# Patient Record
Sex: Female | Born: 1975 | Race: Black or African American | Hispanic: No | Marital: Married | Smoking: Never smoker
Health system: Southern US, Community
[De-identification: ages and names within clinical notes are randomized; demographics above are authoritative.]

## PROBLEM LIST (undated history)

## (undated) DIAGNOSIS — O26851 Spotting complicating pregnancy, first trimester: Secondary | ICD-10-CM

## (undated) DIAGNOSIS — Z87440 Personal history of urinary (tract) infections: Secondary | ICD-10-CM

## (undated) DIAGNOSIS — D649 Anemia, unspecified: Secondary | ICD-10-CM

## (undated) DIAGNOSIS — N83202 Unspecified ovarian cyst, left side: Secondary | ICD-10-CM

## (undated) DIAGNOSIS — R638 Other symptoms and signs concerning food and fluid intake: Secondary | ICD-10-CM

## (undated) DIAGNOSIS — Z87898 Personal history of other specified conditions: Secondary | ICD-10-CM

## (undated) DIAGNOSIS — Z8742 Personal history of other diseases of the female genital tract: Secondary | ICD-10-CM

## (undated) DIAGNOSIS — A630 Anogenital (venereal) warts: Secondary | ICD-10-CM

## (undated) DIAGNOSIS — IMO0002 Reserved for concepts with insufficient information to code with codable children: Secondary | ICD-10-CM

## (undated) HISTORY — DX: Anogenital (venereal) warts: A63.0

## (undated) HISTORY — DX: Personal history of other diseases of the female genital tract: Z87.42

## (undated) HISTORY — DX: Personal history of urinary (tract) infections: Z87.440

## (undated) HISTORY — DX: Reserved for concepts with insufficient information to code with codable children: IMO0002

## (undated) HISTORY — DX: Spotting complicating pregnancy, first trimester: O26.851

## (undated) HISTORY — DX: Unspecified ovarian cyst, left side: N83.202

## (undated) HISTORY — DX: Other symptoms and signs concerning food and fluid intake: R63.8

## (undated) HISTORY — DX: Anemia, unspecified: D64.9

## (undated) HISTORY — DX: Personal history of other specified conditions: Z87.898

---

## 2000-03-28 HISTORY — PX: WISDOM TOOTH EXTRACTION: SHX21

## 2003-03-29 DIAGNOSIS — R87619 Unspecified abnormal cytological findings in specimens from cervix uteri: Secondary | ICD-10-CM

## 2003-03-29 DIAGNOSIS — IMO0002 Reserved for concepts with insufficient information to code with codable children: Secondary | ICD-10-CM

## 2003-03-29 HISTORY — DX: Unspecified abnormal cytological findings in specimens from cervix uteri: R87.619

## 2003-03-29 HISTORY — PX: LEEP: SHX91

## 2003-03-29 HISTORY — DX: Reserved for concepts with insufficient information to code with codable children: IMO0002

## 2005-10-26 DIAGNOSIS — Z8742 Personal history of other diseases of the female genital tract: Secondary | ICD-10-CM

## 2005-10-26 HISTORY — DX: Personal history of other diseases of the female genital tract: Z87.42

## 2005-10-31 ENCOUNTER — Other Ambulatory Visit: Admission: RE | Admit: 2005-10-31 | Discharge: 2005-10-31 | Payer: Self-pay | Admitting: Obstetrics and Gynecology

## 2006-07-04 ENCOUNTER — Other Ambulatory Visit: Admission: RE | Admit: 2006-07-04 | Discharge: 2006-07-04 | Payer: Self-pay | Admitting: Family Medicine

## 2007-07-04 ENCOUNTER — Other Ambulatory Visit: Admission: RE | Admit: 2007-07-04 | Discharge: 2007-07-04 | Payer: Self-pay | Admitting: Family Medicine

## 2008-02-15 DIAGNOSIS — Z87898 Personal history of other specified conditions: Secondary | ICD-10-CM

## 2008-02-15 DIAGNOSIS — O26851 Spotting complicating pregnancy, first trimester: Secondary | ICD-10-CM

## 2008-02-15 HISTORY — DX: Spotting complicating pregnancy, first trimester: O26.851

## 2008-02-15 HISTORY — DX: Personal history of other specified conditions: Z87.898

## 2008-03-28 DIAGNOSIS — R638 Other symptoms and signs concerning food and fluid intake: Secondary | ICD-10-CM

## 2008-03-28 HISTORY — DX: Other symptoms and signs concerning food and fluid intake: R63.8

## 2008-10-20 ENCOUNTER — Inpatient Hospital Stay (HOSPITAL_COMMUNITY): Admission: RE | Admit: 2008-10-20 | Discharge: 2008-10-24 | Payer: Self-pay | Admitting: Obstetrics and Gynecology

## 2010-01-07 DIAGNOSIS — N83202 Unspecified ovarian cyst, left side: Secondary | ICD-10-CM

## 2010-01-07 HISTORY — DX: Unspecified ovarian cyst, left side: N83.202

## 2010-04-14 ENCOUNTER — Encounter (INDEPENDENT_AMBULATORY_CARE_PROVIDER_SITE_OTHER): Payer: Self-pay | Admitting: Surgery

## 2010-04-14 ENCOUNTER — Ambulatory Visit (HOSPITAL_COMMUNITY)
Admission: RE | Admit: 2010-04-14 | Discharge: 2010-04-16 | Payer: Self-pay | Source: Home / Self Care | Attending: Surgery | Admitting: Surgery

## 2010-04-19 LAB — CBC
HCT: 39 % (ref 36.0–46.0)
HCT: 37.4 % (ref 36.0–46.0)
HCT: 38.6 % (ref 36.0–46.0)
HCT: 36.2 % (ref 36.0–46.0)
Hemoglobin: 12.9 g/dL (ref 12.0–15.0)
Hemoglobin: 12.3 g/dL (ref 12.0–15.0)
Hemoglobin: 12.4 g/dL (ref 12.0–15.0)
Hemoglobin: 12 g/dL (ref 12.0–15.0)
MCH: 28.4 pg (ref 26.0–34.0)
MCH: 28.3 pg (ref 26.0–34.0)
MCH: 27.6 pg (ref 26.0–34.0)
MCH: 27.8 pg (ref 26.0–34.0)
MCHC: 33.1 g/dL (ref 30.0–36.0)
MCHC: 32.9 g/dL (ref 30.0–36.0)
MCHC: 32.1 g/dL (ref 30.0–36.0)
MCHC: 33.1 g/dL (ref 30.0–36.0)
MCV: 85.7 fL (ref 78.0–100.0)
MCV: 86.2 fL (ref 78.0–100.0)
MCV: 86 fL (ref 78.0–100.0)
MCV: 84 fL (ref 78.0–100.0)
Platelets: 221 10*3/uL (ref 150–400)
Platelets: 231 10*3/uL (ref 150–400)
Platelets: 240 10*3/uL (ref 150–400)
Platelets: 198 10*3/uL (ref 150–400)
RBC: 4.55 MIL/uL (ref 3.87–5.11)
RBC: 4.34 MIL/uL (ref 3.87–5.11)
RBC: 4.49 MIL/uL (ref 3.87–5.11)
RBC: 4.31 MIL/uL (ref 3.87–5.11)
RDW: 15.1 % (ref 11.5–15.5)
RDW: 15.1 % (ref 11.5–15.5)
RDW: 15 % (ref 11.5–15.5)
RDW: 14.8 % (ref 11.5–15.5)
WBC: 4 10*3/uL (ref 4.0–10.5)
WBC: 8.8 10*3/uL (ref 4.0–10.5)
WBC: 6.4 10*3/uL (ref 4.0–10.5)
WBC: 4.9 10*3/uL (ref 4.0–10.5)

## 2010-04-19 LAB — COMPREHENSIVE METABOLIC PANEL
ALT: 568 U/L — ABNORMAL HIGH (ref 0–35)
ALT: 599 U/L — ABNORMAL HIGH (ref 0–35)
ALT: 672 U/L — ABNORMAL HIGH (ref 0–35)
AST: 211 U/L — ABNORMAL HIGH (ref 0–37)
AST: 269 U/L — ABNORMAL HIGH (ref 0–37)
AST: 294 U/L — ABNORMAL HIGH (ref 0–37)
Albumin: 3.9 g/dL (ref 3.5–5.2)
Albumin: 3.3 g/dL — ABNORMAL LOW (ref 3.5–5.2)
Albumin: 3.2 g/dL — ABNORMAL LOW (ref 3.5–5.2)
Alkaline Phosphatase: 130 U/L — ABNORMAL HIGH (ref 39–117)
Alkaline Phosphatase: 116 U/L (ref 39–117)
Alkaline Phosphatase: 109 U/L (ref 39–117)
BUN: 7 mg/dL (ref 6–23)
BUN: 5 mg/dL — ABNORMAL LOW (ref 6–23)
BUN: 6 mg/dL (ref 6–23)
CO2: 24 mEq/L (ref 19–32)
CO2: 24 mEq/L (ref 19–32)
CO2: 25 mEq/L (ref 19–32)
Calcium: 9.7 mg/dL (ref 8.4–10.5)
Calcium: 9.3 mg/dL (ref 8.4–10.5)
Calcium: 9 mg/dL (ref 8.4–10.5)
Chloride: 106 mEq/L (ref 96–112)
Chloride: 105 mEq/L (ref 96–112)
Chloride: 104 mEq/L (ref 96–112)
Creatinine, Ser: 0.8 mg/dL (ref 0.4–1.2)
Creatinine, Ser: 0.79 mg/dL (ref 0.4–1.2)
Creatinine, Ser: 0.75 mg/dL (ref 0.4–1.2)
GFR calc Af Amer: >60 mL/min (ref >60)
GFR calc Af Amer: >60 mL/min (ref >60)
GFR calc Af Amer: >60 mL/min (ref >60)
GFR calc non Af Amer: >60 mL/min (ref >60)
GFR calc non Af Amer: >60 mL/min (ref >60)
GFR calc non Af Amer: >60 mL/min (ref >60)
Glucose, Bld: 84 mg/dL (ref 70–99)
Glucose, Bld: 91 mg/dL (ref 70–99)
Glucose, Bld: 83 mg/dL (ref 70–99)
Potassium: 4.1 mEq/L (ref 3.5–5.1)
Potassium: 4.4 mEq/L (ref 3.5–5.1)
Potassium: 3.9 mEq/L (ref 3.5–5.1)
Sodium: 139 mEq/L (ref 135–145)
Sodium: 137 mEq/L (ref 135–145)
Sodium: 138 mEq/L (ref 135–145)
Total Bilirubin: 7.3 mg/dL — ABNORMAL HIGH (ref 0.3–1.2)
Total Bilirubin: 6 mg/dL — ABNORMAL HIGH (ref 0.3–1.2)
Total Bilirubin: 5 mg/dL — ABNORMAL HIGH (ref 0.3–1.2)
Total Protein: 8.2 g/dL (ref 6.0–8.3)
Total Protein: 7.3 g/dL (ref 6.0–8.3)
Total Protein: 6.5 g/dL (ref 6.0–8.3)

## 2010-04-19 LAB — DIFFERENTIAL
Basophils Absolute: 0 10*3/uL (ref 0.0–0.1)
Basophils Absolute: 0 10*3/uL (ref 0.0–0.1)
Basophils Absolute: 0 10*3/uL (ref 0.0–0.1)
Basophils Relative: 1 % (ref 0–1)
Basophils Relative: 1 % (ref 0–1)
Basophils Relative: 0 % (ref 0–1)
Eosinophils Absolute: 0.1 10*3/uL (ref 0.0–0.7)
Eosinophils Absolute: 0.1 10*3/uL (ref 0.0–0.7)
Eosinophils Absolute: 0 10*3/uL (ref 0.0–0.7)
Eosinophils Relative: 2 % (ref 0–5)
Eosinophils Relative: 1 % (ref 0–5)
Eosinophils Relative: 0 % (ref 0–5)
Lymphocytes Relative: 47 % — ABNORMAL HIGH (ref 12–46)
Lymphocytes Relative: 36 % (ref 12–46)
Lymphocytes Relative: 22 % (ref 12–46)
Lymphs Abs: 1.9 10*3/uL (ref 0.7–4.0)
Lymphs Abs: 3.1 10*3/uL (ref 0.7–4.0)
Lymphs Abs: 1.4 10*3/uL (ref 0.7–4.0)
Monocytes Absolute: 0.3 10*3/uL (ref 0.1–1.0)
Monocytes Absolute: 0.3 10*3/uL (ref 0.1–1.0)
Monocytes Absolute: 0.3 10*3/uL (ref 0.1–1.0)
Monocytes Relative: 7 % (ref 3–12)
Monocytes Relative: 3 % (ref 3–12)
Monocytes Relative: 5 % (ref 3–12)
Neutro Abs: 1.8 10*3/uL (ref 1.7–7.7)
Neutro Abs: 5.3 10*3/uL (ref 1.7–7.7)
Neutro Abs: 4.6 10*3/uL (ref 1.7–7.7)
Neutrophils Relative %: 44 % (ref 43–77)
Neutrophils Relative %: 60 % (ref 43–77)
Neutrophils Relative %: 73 % (ref 43–77)

## 2010-04-19 LAB — BASIC METABOLIC PANEL
BUN: 6 mg/dL (ref 6–23)
CO2: 23 mEq/L (ref 19–32)
Calcium: 9.2 mg/dL (ref 8.4–10.5)
Chloride: 107 mEq/L (ref 96–112)
Creatinine, Ser: 0.82 mg/dL (ref 0.4–1.2)
GFR calc Af Amer: >60 mL/min (ref >60)
GFR calc non Af Amer: >60 mL/min (ref >60)
Glucose, Bld: 122 mg/dL — ABNORMAL HIGH (ref 70–99)
Potassium: 3.7 mEq/L (ref 3.5–5.1)
Sodium: 138 mEq/L (ref 135–145)

## 2010-04-19 LAB — PROTIME-INR
INR: 0.94 (ref 0.00–1.49)
INR: 0.97 (ref 0.00–1.49)
Prothrombin Time: 12.8 seconds (ref 11.6–15.2)
Prothrombin Time: 13.1 seconds (ref 11.6–15.2)

## 2010-04-19 LAB — SURGICAL PCR SCREEN
MRSA, PCR: NEGATIVE
Staphylococcus aureus: NEGATIVE

## 2010-04-19 LAB — APTT: aPTT: 37 seconds (ref 24–37)

## 2010-04-19 LAB — PREGNANCY, URINE: Preg Test, Ur: NEGATIVE

## 2010-04-19 LAB — LIPASE, BLOOD: Lipase: 39 U/L (ref 11–59)

## 2010-04-23 NOTE — Op Note (Signed)
Tara [name]               ACCOUNT NO.:  [id number]  MEDICAL RECORD NO.:  [id number]          PATIENT TYPE:  AMB  LOCATION:  DAY                          FACILITY:  [facility]  PHYSICIAN:  Thornton Park. Daphine Deutscher, MD  DATE OF BIRTH:  1975-05-22  DATE OF PROCEDURE:  04/14/2010 DATE OF DISCHARGE:                              OPERATIVE REPORT   PREOPERATIVE DIAGNOSES:  Gallstones and common bile duct stones with elevated liver function tests.  POSTOPERATIVE DIAGNOSES: 1. Gallstones and common bile duct stones with elevated liver function     tests. 2. Chronic cholecystitis.  PROCEDURE:  Laparoscopic cholecystectomy with intraoperative cholangiogram and placement of a 19 Blake drain.  SURGEON:  Luretha Murphy, MD  ASSISTANT:  Consuello Bossier, MD  ANESTHESIA:  General endotracheal.  DESCRIPTION OF PROCEDURE:  Tara [name] is a 35 year old lady who has had bouts of abdominal pain and recently has had some elevation of her liver function studies and bilirubin.  I saw her in the office last Friday, and arrangements were made for cholecystectomy today.  Informed consent was obtained.  The patient was taken to room one on the afternoon of April 14, 2010 and given general anesthesia.  The abdomen was prepped with Techni-Care equivalent and draped sterilely. I attempted to go in with a 5 mm through the umbilicus, elevating the umbilicus.  I got into the preperitoneal space.  I went ahead and cut down longitudinally and made a big enough incision for a standard Hassan approach and entered the abdomen without difficulty.  The abdomen was insufflated.  A 10 was placed the upper abdomen and two 5 laterally.  She had numerous adhesions to the gallbladder fundus and neck.  These were taken down with sharp dissection.  The fundus was then elevated.  She had a lot of inflammatory changes here in the cystic duct.  The cystic duct was quite dilated which I could see.  I ended up putting a  clip along the gallbladder side incising the cystic duct and tried to pass a catheter. I actually kind of got a back wall of part of the cystic duct.  It was dilated and took a cholangiogram which showed actually distally to that a much smaller little cystic duct and then a markedly dilated common bile duct with multiple stones and filling defects.  We then ligated the cystic duct with a 2-0 silk passed around it and then tied down with an Ethicon tying [place].  An Endo loop using PDS was then passed around it after I had cut it, and it was ligated.  The gallbladder was then removed with a hook cautery again without difficulty, and once it was detached, it was placed in a bag and brought out through the umbilicus.  We went back and looked at the gallbladder bed.  No bleeding or bile leaks were noted.  Because of the size the cystic duct, I went ahead and put a drain in in case there might be a leak.  The abdomen was deflated, and the port sites were all injected with Marcaine and closed.  The umbilical defect was repaired with three sutures  of 0-Vicryl under laparoscopic vision.  4-0 Vicryl was used in the skin, Benzoin and Steri-Strips.  The patient tolerated the procedure well and was taken to the recovery room in satisfactory condition.     Thornton Park Daphine Deutscher, MD     MBM/MEDQ  D:  04/14/2010  T:  04/14/2010  Job:  [id number]  cc:   Shirley Friar, MD Fax: [phone]  Electronically Signed by Luretha Murphy MD on 04/23/2010 08:30:45 PM

## 2010-06-03 NOTE — Op Note (Signed)
  Tara [name], Tara [name]               ACCOUNT NO.:  [id number]  MEDICAL RECORD NO.:  [id number]          PATIENT TYPE:  OIB  LOCATION:  1531                         FACILITY:  [facility]  PHYSICIAN:  Illiana Losurdo C. Madilyn Fireman, M.D.    DATE OF BIRTH:  Aug 20, 1975  DATE OF PROCEDURE: DATE OF DISCHARGE:                              OPERATIVE REPORT   PROCEDURE:  Endoscopic retrograde cholangiopancreatography with sphincterotomy and stone extraction.  INDICATIONS FOR PROCEDURE:  Common bile duct stones seen on intraoperative cholangiogram at time of laparoscopic cholecystectomy.  PROCEDURE:  The patient was placed in the prone position and placed on the pulse monitor with continuous low-flow oxygen delivered by nasal cannula.  He was sedated with 100 mcg of IV fentanyl and 7.5 mg IV Versed.  Olympus side-viewing endoscope was advanced blindly into the oropharynx, esophagus, stomach.  The pylorus was traversed in the papilla of Vater located on the medial duodenal wall.  It had an enlarged bulging appearance and was draining clear amber bile.  The Wilson-Cook sphincterotome was used to cannulate common bile duct which appeared dilated and had at least one mobile filling defect.  A large sphincterotomy was performed and then the adjustable 12-18 mm balloon was inflated in the common hepatic duct and dragged down through the papilla multiple times with one multifaceted stone delivered approximately 4 to 5 mm in diameter.  No other stones or stone fragments were seen to be delivered.  After multiple sweeps, the final cholangiogram revealed no obvious filling defect and there was good drainage.  The pancreatic duct was not injected.  The scope was then withdrawn and the patient returned to the recovery room in stable condition.  She tolerated the procedure well and there were no immediate complications.  IMPRESSION:  Common bile duct stone removed after sphincterotomy.  PLAN:  Advance diet gradually and  recheck liver function tests tomorrow.          ______________________________ Everardo All Madilyn Fireman, M.D.     JCH/MEDQ  D:  04/15/2010  T:  04/15/2010  Job:  [id number]  Electronically Signed by Dorena Cookey M.D. on 06/01/2010 07:32:29 PM

## 2010-06-03 NOTE — Consult Note (Signed)
  NAMEREBECKA, OELKERS               ACCOUNT NO.:  [id number]  MEDICAL RECORD NO.:  [id number]          PATIENT TYPE:  OIB  LOCATION:  1531                         FACILITY:  [facility]  PHYSICIAN:  Jahniya Duzan C. Madilyn Fireman, M.D.    DATE OF BIRTH:  [birth year]  DATE OF CONSULTATION:  04/15/2010 DATE OF DISCHARGE:                                CONSULTATION   REASON FOR CONSULTATION:  Common bile duct stone on intraoperative cholangiogram.  HISTORY OF PRESENT ILLNESS:  The patient is a 35 year old black female who presents with abdominal pain and jaundice with elevated liver function test.  Laparoscopic cholecystectomy was performed yesterday and showed multiple gallstones with dilated common bile duct and several common bile duct stones.  We are consulted for ERCP with removal of stones.  Her bilirubin today was 6.0, AST 1, AST 269, alkaline phosphatase 116, ALT 599.  She is on antibiotics Invanz.  She is feeling well.  PAST MEDICAL HISTORY:  Essentially noncontributory.  SURGERIES:  None.  ALLERGIES:  None.  MEDICATIONS:  None.  FAMILY HISTORY:  Noncontributory.  PHYSICAL EXAMINATION:  GENERAL:  Moderately obese black female, in no acute distress, moderately jaundiced. ABDOMEN:  Soft with fresh laparoscopy wounds.  Mild diffuse tenderness to light palpation around the wound site.  IMPRESSION:  Retained common bile duct stones after cholecystectomy.  PLAN:  We will proceed with ERCP today.  Risks, rationale, alternatives were explained to the patient.  She wished to proceed.  This will be done in near future.          ______________________________ Everardo All Madilyn Fireman, M.D.     JCH/MEDQ  D:  04/15/2010  T:  04/15/2010  Job:  [id number]  cc:   Thornton Park Daphine Deutscher, MD [address] [place] [address]  Electronically Signed by Dorena Cookey M.D. on 06/01/2010 07:32:29 PM

## 2010-07-04 LAB — COMPREHENSIVE METABOLIC PANEL
ALT: 13 U/L (ref 0–35)
AST: 21 U/L (ref 0–37)
Albumin: 2.8 g/dL — ABNORMAL LOW (ref 3.5–5.2)
Alkaline Phosphatase: 150 U/L — ABNORMAL HIGH (ref 39–117)
BUN: 5 mg/dL — ABNORMAL LOW (ref 6–23)
CO2: 21 mEq/L (ref 19–32)
Calcium: 9.2 mg/dL (ref 8.4–10.5)
Chloride: 103 mEq/L (ref 96–112)
Creatinine, Ser: 0.48 mg/dL (ref 0.4–1.2)
GFR calc Af Amer: >60 mL/min (ref >60)
GFR calc non Af Amer: >60 mL/min (ref >60)
Glucose, Bld: 89 mg/dL (ref 70–99)
Potassium: 4.1 mEq/L (ref 3.5–5.1)
Sodium: 135 mEq/L (ref 135–145)
Total Bilirubin: 0.6 mg/dL (ref 0.3–1.2)
Total Protein: 6 g/dL (ref 6.0–8.3)

## 2010-07-04 LAB — CBC
HCT: 24.9 % — ABNORMAL LOW (ref 36.0–46.0)
HCT: 32.7 % — ABNORMAL LOW (ref 36.0–46.0)
Hemoglobin: 11.1 g/dL — ABNORMAL LOW (ref 12.0–15.0)
Hemoglobin: 8.6 g/dL — ABNORMAL LOW (ref 12.0–15.0)
MCHC: 33.8 g/dL (ref 30.0–36.0)
MCHC: 34.6 g/dL (ref 30.0–36.0)
MCV: 87.1 fL (ref 78.0–100.0)
MCV: 87.4 fL (ref 78.0–100.0)
Platelets: 163 10*3/uL (ref 150–400)
Platelets: 182 10*3/uL (ref 150–400)
RBC: 2.85 MIL/uL — ABNORMAL LOW (ref 3.87–5.11)
RBC: 3.76 MIL/uL — ABNORMAL LOW (ref 3.87–5.11)
RDW: 15.2 % (ref 11.5–15.5)
RDW: 15.4 % (ref 11.5–15.5)
WBC: 15.1 10*3/uL — ABNORMAL HIGH (ref 4.0–10.5)
WBC: 7.2 10*3/uL (ref 4.0–10.5)

## 2010-07-04 LAB — RPR: RPR Ser Ql: NONREACTIVE

## 2010-07-04 LAB — URIC ACID: Uric Acid, Serum: 4.3 mg/dL (ref 2.4–7.0)

## 2010-07-04 LAB — LACTATE DEHYDROGENASE: LDH: 175 U/L (ref 94–250)

## 2010-08-10 NOTE — Discharge Summary (Signed)
Tara [name], [name]               ACCOUNT NO.:  [id number]   MEDICAL RECORD NO.:  [id number]          PATIENT TYPE:  INP   LOCATION:  9146                          [facility]AC[facility]:  [facility]   PHYSICIAN:  Naima A. Dillard, M.D. DATE OF BIRTH:  05-14-1975   DATE OF ADMISSION:  10/20/2008  DATE OF DISCHARGE:  10/24/2008                               DISCHARGE SUMMARY   ADMITTING DIAGNOSIS:  Intrauterine pregnancy at 76 and 6/7, admission  for term induction of labor.  Denies leakage of fluid followed by the  CNM Service at [facility].   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  She is obese.  History of  anemia during this pregnancy.  She denies medication allergy, latex,  drugs.  Her blood type is O positive.  Antibody negative.  Sickle cell  screen negative.  RPR negative.  Rubella immune.  Hepatitis antigen  negative.  Gonorrhea and Chlamydia cultures were done on November 20,  were negative.  Hemoglobin was 12 and her platelets were 275.  She was  admitted to the Labor and Delivery area where Cytotec was inserted to  facilitate delivery after 12 hours of medication.  Her cervix did not  change and Pitocin was started, low-dose.  Her hemoglobin at that point  was 11.1.  On October 20, 2008, at 9:45, no cervical change, discussed with  Dr. Normand Sloop and will discontinue the Pitocin and reinsert Cervidil and  she had an epidural administered on October 21, 2008, and after an hour or  2 of the epidural, she was rechecked and she was 8 cm, contractions were  every 2 minutes lasting 40-60 seconds.  Fetal heart rate was 140-150.  Head compression pattern was noted and she was turned from side to side  to facilitate recovery.  Labor progressed and her Pitocin was increased  due to no cervical change after 2 hours and her vaginal exam was 9+, 0  station.  The baby was okay.  At this point, the Pitocin was 4  milliunits.   We did increase the Pitocin and Dr. Stefano Gaul was given a  report.  She became complete on October 22, 2008, at 3:41 and on October 22, 2008, at 5:32, she had spontaneous vaginal delivery of a viable female  infant weighing 7 pounds 4 ounces of her second-degree laceration that  was repaired.  Baby rotated from OA to ROA and anterior and posterior  shoulders were delivered without incident.  The baby's Apgar was 8 and  9.  The baby's name is Tara [name].  The placenta was complete and intact with  3 cord vessels, stroke mechanism, 20 units of Pitocin IM with trailing  membranes noted that were removed.  Laceration was repaired with 3-0  Vicryl.  Hemostasis was obtained and fundus remained firm.  EBL 400 mL.  Dr. Stefano Gaul present at the birth and NICU was present due to thin  meconium with rupture of membranes.  On day of discharge, she was  feeling fine and she wanted to go home.  She is  breastfeeding.  Hemoglobin was 8.6, down from 11.1 on admission.   PHYSICAL EXAMINATION:  VITAL SIGNS:  Temperature 98.4, 93 pulse,  respirations of 20, and blood pressure 125/61 to 113/73.  LUNGS:  Clear bilaterally.  HEART:  Regular rate without murmur.  ABDOMEN:  Fundus is firm below umbilicus and lochia was rubra, healing  perineum and negative Homans.   ASSESSMENT:  Postpartum day #2 stable.  Anemia, nonsymptomatic and she  is breastfeeding.  Discharge home with [facility] pamphlet.  Medications were ferrous sulfate b.i.d. and prescription was given and  ibuprofen 800 mg was given also with instructions.  Questions were  answered concerning care of herself and baby after discharge home.  She  is to return to [facility] at 6 weeks for disposition of  birth control method.  At this point, she is not sure what she wants.  She is considering the Mirena.      Jasmine Awe, CNM      Naima A. Normand Sloop, M.D.  Electronically Signed    JM/MEDQ  D:  10/24/2008  T:  10/25/2008   Job:  [id number]

## 2010-08-10 NOTE — H&P (Signed)
Tara [name], Tara [name]               ACCOUNT NO.:  [id number]   MEDICAL RECORD NO.:  [id number]          PATIENT TYPE:  INP   LOCATION:  9174                          FACILITY:  [facility]   PHYSICIAN:  Naima A. Dillard, M.D. DATE OF BIRTH:  04-Feb-1976   DATE OF ADMISSION:  10/20/2008  DATE OF DISCHARGE:                              HISTORY & PHYSICAL   HISTORY OF PRESENT ILLNESS:  Ms. Tara [name] is a 35 year old married black  female, primigravida, at 41-3/7 weeks per an [facility] of October 11, 2008 who  presents for admission for term induction of labor.  She is without  complaints this morning.  She reported good fetal movement.  No leakage  of fluid or vaginal bleeding.  Followed by the CNM service at [facility].  History remarkable for:  1. 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  2. First trimester spotting, subchorionic hemorrhage.  3. Obese.  4. History of anemia this pregnancy.   ALLERGIES:  She denies medication or latex allergy.   MENSTRUAL HISTORY:  Menarche age 36, monthly cycles.  No abnormalities.  LMP of January 04, 2008, giving her best [facility] of October 11, 2008.   OBSTETRICAL HISTORY:  She is a primigravida.   PAST MEDICAL HISTORY:  1. History of a LEEP, 2005 with a positive HPV.  2. Reports varicella as a child.  3. Normal childhood illnesses.   SURGICAL HISTORY:  Otherwise remarkable for wisdom teeth, 2002.   FAMILY HISTORY:  Paternal uncle heart attack.  Mother hypertension.  Sister von Willebrand syndrome.  Mother and father non-insulin dependent  diabetics.  Paternal grandmother breast cancer.  She has some paternal  aunts with lung cancer.  Also paternal aunts with some unknown type of  cancer.   GENETIC HISTORY:  Father of baby's nephew, sickle cell disease or trait.  Father of baby's brother, MS.  Father of baby has twin sisters.   SOCIAL HISTORY:  She is now a married African American female.  She is  of [place] faith.  Her husband's name is Gaffer.  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  Father of baby works full-time also in Presenter, broadcasting, has had 16 years  of education.  He is involved and supportive.  She denied alcohol,  tobacco, or illicit drug use.   LABORATORY DATA:  Prenatal labs:  Her blood type is O+, Rh antibody  screen negative.  Sickle cell screen negative.  RPR nonreactive, rubella  titer immune.  Hepatitis surface antigen negative, HIV nonreactive.  Gonorrhea and chlamydia cultures November 20 were negative.  On November  20 her hemoglobin was 12.3.  Her platelets were 271.   She reported a pregravid weight of 215.  At her initial visit at [facility]  she was 225.  She is 5 feet 6 inches.  She complained of some pink  spotting February 14, 2008.  She entered care at [facility] on  November 20 and was around 6 weeks for her new OB interview.  She was  evaluated by our nurse practitioner in the office.  On November 24 she  was prescribed Macrobid for a UTI first trimester.  At 7 weeks and 4  days she had a viability ultrasound and EDC only varied by one date.  New OB exam was done at 10 weeks and 3 days.  She did have fetal heart  tones per Doppler, 170s.  Spotting had resolved.  She planned CNM care  and culture was negative at end of December.  Pap was within normal  limits.  Had a first trimester screen January 11, was within normal  limits.  Anatomy ultrasound was done at 19 weeks and 4 days.  They are  expecting a girl.  Size was equal to dates.  Fundal placenta, three-  vessel cord, cervix 3.78 cm, normal fluid.  All the cardiac anatomy was  not seen and did have a repeat ultrasound to visualize remaining anatomy  and was within normal limits and that one was done at 23 weeks.  She had  some upper respiratory complaints in second trimester.  Had a pipe burst  in her house and felt like it was related to the chemicals they used to  repair that.  Had early Glucola at 23 weeks  that was  within normal  limits equal to 105.  Did have her TSH checked.  It was 3.197,  hemoglobin was 11.1.  She had a repeat Glucola at 30 weeks and 3 days  and was within normal limits.  RPR was nonreactive.  She was started on  iron supplements twice a day.  GBS was negative.  In her third trimester  planning [facility] in [place] for pediatrician.  Weight was 240 at 37 weeks and 2 days and as I mentioned her initial  weight was 220-225.  Her pregnancy continued to progress without any  other complications until her presentation today for induction of labor.   OBJECTIVE:  VITAL SIGNS:  Her initial blood pressure on admission was  142/110 and heart rate was 112.  The repeat was 130/84.  She was  afebrile.  Other vital signs stable.  Fetal heart rate 140, reactive, no  decels, moderate variability.  Toco showed occasional ripples on the  monitor which are very mild to palpation.  She did have some PIH labs  drawn.  Her white blood cell count was 7.2, hemoglobin 11.1, hematocrit  32.7, and platelets were 182.  Her CMET was within normal limits.  Just  to note AST was 21, ALT 13, LDH 175.  Uric acid 4.3.   PHYSICAL EXAMINATION:  GENERAL:  No acute distress, alert and oriented  x3 and pleasant.  HEENT:  Within normal limits.  CARDIOVASCULAR:  Regular rate and rhythm.  LUNGS:  Clear to auscultation bilaterally.  ABDOMEN:  Soft, nontender, gravid.  CERVIX:  1-1/2, 80, -2, vertex, mid position.  EXTREMITIES:  1+ kind of generalized edema, no clonus.  DTRs were 1+.   IMPRESSION:  1. Intrauterine pregnancy at 41weeks and 3 days.  2. Term.  3. Reactive fetal heart tracing.  4. Borderline blood pressures with normal PIH labs.   PLAN:  1. Admit to birthing suites with Dr. Normand Sloop as attending physician.  2. Routine L and D orders.  3. Pitocin IV per low-dose protocol.  4. Consult with M.D. p.r.n.      Candice [place], CNM      Naima A. Normand Sloop, M.D.   Electronically Signed    CHS/MEDQ  D:  10/20/2008  T:  10/20/2008  Job:  [id number]

## 2011-07-26 ENCOUNTER — Ambulatory Visit: Payer: Self-pay | Admitting: Obstetrics and Gynecology

## 2011-08-11 ENCOUNTER — Ambulatory Visit (INDEPENDENT_AMBULATORY_CARE_PROVIDER_SITE_OTHER): Payer: BC Managed Care – HMO | Admitting: Obstetrics and Gynecology

## 2011-08-11 ENCOUNTER — Encounter: Payer: Self-pay | Admitting: Obstetrics and Gynecology

## 2011-08-11 VITALS — BP 120/76 | Resp 18 | Ht 66.5 in | Wt 228.0 lb

## 2011-08-11 DIAGNOSIS — Z124 Encounter for screening for malignant neoplasm of cervix: Secondary | ICD-10-CM

## 2011-08-11 NOTE — Progress Notes (Signed)
Contraception None/ tyring to conceive Last pap 11/2009 Last Mammo none Last Colonoscopy none Last Dexa Scan none Primary MD Dr. Lupe Carney Abuse at Home None  No complaints.  Trying to conceive.  Filed Vitals:   08/11/11 1603  BP: 120/76  Resp: 18   ROS: noncontributory  Physical Examination: General appearance - alert, well appearing, and in no distress Neck - supple, no significant adenopathy Chest - clear to auscultation, no wheezes, rales or rhonchi, symmetric air entry Heart - normal rate and regular rhythm Abdomen - soft, nontender, nondistended, no masses or organomegaly Breasts - breasts appear normal, no suspicious masses, no skin or nipple changes or axillary nodes Pelvic - normal external genitalia, vulva, vagina, cervix, uterus and adnexa Back exam - no CVAT Extremities - no edema, redness or tenderness in the calves or thighs  A/P Pap RTO for AEX or prn

## 2011-08-16 LAB — PAP IG W/ RFLX HPV ASCU

## 2011-08-18 ENCOUNTER — Telehealth: Payer: Self-pay

## 2011-08-18 NOTE — Telephone Encounter (Signed)
Spoke to pt re pap results and rec for colpo. Colpo scheduled for 09/05/2011. Tara [name] A

## 2011-08-18 NOTE — Telephone Encounter (Signed)
LM for pt to cb re: test results and recommendations. She needs colpo. 09/05/2011 appt available @ 4pm if that works for pt. Melody Comas A

## 2011-08-23 ENCOUNTER — Telehealth: Payer: Self-pay

## 2011-08-23 NOTE — Telephone Encounter (Signed)
I spoke to pt to answer ?'s re: pap results, HPV and colposcopy. I sent her info on abnl paps, HPV and colposcopy in the mail.  Pt is scheduled for colpo on 09/05/2011. Melody Comas A

## 2011-09-05 ENCOUNTER — Ambulatory Visit (INDEPENDENT_AMBULATORY_CARE_PROVIDER_SITE_OTHER): Payer: BC Managed Care – HMO | Admitting: Obstetrics and Gynecology

## 2011-09-05 ENCOUNTER — Encounter: Payer: Self-pay | Admitting: Obstetrics and Gynecology

## 2011-09-05 VITALS — BP 110/62 | Ht 67.2 in | Wt 234.0 lb

## 2011-09-05 DIAGNOSIS — IMO0002 Reserved for concepts with insufficient information to code with codable children: Secondary | ICD-10-CM

## 2011-09-05 DIAGNOSIS — R87612 Low grade squamous intraepithelial lesion on cytologic smear of cervix (LGSIL): Secondary | ICD-10-CM

## 2011-09-05 LAB — POCT URINE PREGNANCY: Preg Test, Ur: NEGATIVE

## 2011-09-05 NOTE — Progress Notes (Signed)
S:No complaints O: Filed Vitals:   09/05/11 1615  BP: 110/62   Colpo performed per protocol TZ seen AW lesions at 2, 4 and 9 O'Clock Bxs done  A/P RTO 2 wks for f/u Many Questions answered

## 2011-09-07 LAB — PATHOLOGY

## 2011-09-26 ENCOUNTER — Encounter: Payer: BC Managed Care – HMO | Admitting: Obstetrics and Gynecology

## 2011-10-10 ENCOUNTER — Encounter: Payer: Self-pay | Admitting: Obstetrics and Gynecology

## 2011-10-10 ENCOUNTER — Ambulatory Visit (INDEPENDENT_AMBULATORY_CARE_PROVIDER_SITE_OTHER): Payer: BC Managed Care – HMO | Admitting: Obstetrics and Gynecology

## 2011-10-10 VITALS — BP 100/64 | Ht 66.0 in | Wt 236.0 lb

## 2011-10-10 DIAGNOSIS — IMO0002 Reserved for concepts with insufficient information to code with codable children: Secondary | ICD-10-CM

## 2011-10-10 DIAGNOSIS — R87612 Low grade squamous intraepithelial lesion on cytologic smear of cervix (LGSIL): Secondary | ICD-10-CM

## 2011-10-10 DIAGNOSIS — N870 Mild cervical dysplasia: Secondary | ICD-10-CM

## 2011-10-10 NOTE — Progress Notes (Signed)
Report Comments: FINAL DIAGNOSIS: A. 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 See comment.   B. 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  See comment.  C. Cervix- Biopsy, 9 o'clock:  Cervical transformation zone with low grade squamous intraepithelial lesion  (LSIL), mild dysplasia and HPV infection, CIN I.  See comment.  D. Endocervix - Curettage:  Small fragment of the cervical transformation zone with low grade squamous  intraepithelial lesion (LSIL), mild dysplasia and HPV infection, CIN I. Multiple fragments of benign endocervical mucosa.  See comment.   HPV not detected  No complaints Results Reviewed Questions answered RTO q 4-15mths for repeat pap

## 2011-10-17 ENCOUNTER — Telehealth: Payer: Self-pay

## 2011-10-17 NOTE — Telephone Encounter (Signed)
Lm for patient to return call regarding pap results.Tara [name]

## 2011-10-18 ENCOUNTER — Telehealth: Payer: Self-pay

## 2011-10-18 NOTE — Telephone Encounter (Signed)
TC CALL FROM PT REGARDING PHONE CALL. INFORMED PT THAT SHE NEED TO REPEAT PAP IN OCT OR NOV. PT STATES THAT SHE WILL DO IT IN OCT. SCHEDULED APPT ON IN OCT WITH AR. PT VOICED UNDERSTANDING.

## 2011-10-25 ENCOUNTER — Telehealth: Payer: Self-pay

## 2011-10-25 NOTE — Telephone Encounter (Signed)
LM for pt to cb re: test results and recs. Melody Comas A

## 2011-10-28 ENCOUNTER — Telehealth: Payer: Self-pay

## 2011-10-28 NOTE — Telephone Encounter (Signed)
Pt called back. I informed her of negative high risk HPV testing. Recommendations are that she have a repeat pap in Oct or Nov per AR. Pt is already sched for Oct. 8th. She was very relieved to get this news! We will see her in Oct. Marga Hoots, Adela Lank A

## 2011-10-28 NOTE — Telephone Encounter (Signed)
LM for pt to cb for test results and AR's recommendations. Tara [name] A

## 2012-01-03 ENCOUNTER — Encounter: Payer: Self-pay | Admitting: Obstetrics and Gynecology

## 2012-01-03 ENCOUNTER — Ambulatory Visit (INDEPENDENT_AMBULATORY_CARE_PROVIDER_SITE_OTHER): Payer: BC Managed Care – HMO | Admitting: Obstetrics and Gynecology

## 2012-01-03 VITALS — BP 112/74 | Resp 16 | Ht 66.5 in | Wt 239.0 lb

## 2012-01-03 DIAGNOSIS — R87612 Low grade squamous intraepithelial lesion on cytologic smear of cervix (LGSIL): Secondary | ICD-10-CM

## 2012-01-03 DIAGNOSIS — IMO0002 Reserved for concepts with insufficient information to code with codable children: Secondary | ICD-10-CM

## 2012-01-03 DIAGNOSIS — R6889 Other general symptoms and signs: Secondary | ICD-10-CM

## 2012-01-03 NOTE — Progress Notes (Signed)
Here for repeat pap sec to CIN 1 in July  Filed Vitals:   01/03/12 0858  BP: 112/74  Resp: 16   ROS: noncontributory  Pelvic exam:  VULVA: normal appearing vulva with no masses, tenderness or lesions,  VAGINA: normal appearing vagina with normal color and discharge, no lesions, CERVIX: normal appearing cervix without discharge or lesions,   A/P Repeat pap today Cont q4-13mths repeat pap x 56yr

## 2012-01-03 NOTE — Patient Instructions (Signed)
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

## 2012-01-04 LAB — PAP IG W/ RFLX HPV ASCU

## 2012-01-06 LAB — HUMAN PAPILLOMAVIRUS, HIGH RISK: HPV DNA High Risk: NOT DETECTED

## 2012-01-12 ENCOUNTER — Encounter: Payer: Self-pay | Admitting: Obstetrics and Gynecology

## 2013-05-20 ENCOUNTER — Other Ambulatory Visit: Payer: Self-pay | Admitting: Obstetrics and Gynecology

## 2013-05-20 DIAGNOSIS — N970 Female infertility associated with anovulation: Secondary | ICD-10-CM

## 2013-05-27 ENCOUNTER — Ambulatory Visit (HOSPITAL_COMMUNITY)
Admission: RE | Admit: 2013-05-27 | Discharge: 2013-05-27 | Disposition: A | Discharge status: Home / Self Care | Payer: Federal, State, Local not specified - PPO | Source: Ambulatory Visit | Attending: Obstetrics and Gynecology | Admitting: Obstetrics and Gynecology

## 2013-05-27 ENCOUNTER — Ambulatory Visit (HOSPITAL_COMMUNITY): Payer: BC Managed Care – HMO

## 2013-05-27 DIAGNOSIS — N970 Female infertility associated with anovulation: Secondary | ICD-10-CM

## 2013-05-27 DIAGNOSIS — N971 Female infertility of tubal origin: Secondary | ICD-10-CM | POA: Insufficient documentation

## 2013-05-27 MED ORDER — IOHEXOL 300 MG/ML  SOLN
20.0000 mL | Freq: Once | INTRAMUSCULAR | Status: AC | PRN
  Administered 2013-05-27: 20 mL

## 2013-05-31 ENCOUNTER — Other Ambulatory Visit: Payer: Self-pay | Admitting: Obstetrics and Gynecology

## 2013-05-31 DIAGNOSIS — N970 Female infertility associated with anovulation: Secondary | ICD-10-CM

## 2013-06-01 ENCOUNTER — Ambulatory Visit (HOSPITAL_COMMUNITY)
Admission: RE | Admit: 2013-06-01 | Discharge: 2013-06-01 | Disposition: A | Discharge status: Home / Self Care | Payer: Federal, State, Local not specified - PPO | Source: Ambulatory Visit | Attending: Obstetrics and Gynecology | Admitting: Obstetrics and Gynecology

## 2013-06-01 DIAGNOSIS — N970 Female infertility associated with anovulation: Secondary | ICD-10-CM

## 2013-06-01 DIAGNOSIS — D259 Leiomyoma of uterus, unspecified: Secondary | ICD-10-CM | POA: Insufficient documentation

## 2013-06-21 ENCOUNTER — Other Ambulatory Visit: Payer: Self-pay | Admitting: Obstetrics and Gynecology

## 2013-06-21 DIAGNOSIS — N970 Female infertility associated with anovulation: Secondary | ICD-10-CM

## 2013-06-23 ENCOUNTER — Ambulatory Visit (HOSPITAL_COMMUNITY): Payer: Federal, State, Local not specified - PPO

## 2013-06-30 ENCOUNTER — Ambulatory Visit (HOSPITAL_COMMUNITY)
Admission: RE | Admit: 2013-06-30 | Discharge: 2013-06-30 | Disposition: A | Discharge status: Home / Self Care | Payer: Federal, State, Local not specified - PPO | Source: Ambulatory Visit | Attending: Obstetrics and Gynecology | Admitting: Obstetrics and Gynecology

## 2013-06-30 DIAGNOSIS — N979 Female infertility, unspecified: Secondary | ICD-10-CM | POA: Insufficient documentation

## 2013-06-30 DIAGNOSIS — N8300 Follicular cyst of ovary, unspecified side: Secondary | ICD-10-CM | POA: Insufficient documentation

## 2013-06-30 DIAGNOSIS — N970 Female infertility associated with anovulation: Secondary | ICD-10-CM

## 2013-06-30 NOTE — Progress Notes (Signed)
Results called to Dr. Nelda Marseille at 11:42 am.  Pt. Instructed that she will receive call from Dr. Mancel Bale regarding further instructions.  Patient released from [place] department to MAU/Radiology waiting area while she awaits call.

## 2013-07-19 ENCOUNTER — Other Ambulatory Visit: Payer: Self-pay | Admitting: Obstetrics and Gynecology

## 2013-07-19 DIAGNOSIS — N979 Female infertility, unspecified: Secondary | ICD-10-CM

## 2013-07-28 ENCOUNTER — Ambulatory Visit (HOSPITAL_COMMUNITY)
Admission: RE | Admit: 2013-07-28 | Discharge: 2013-07-28 | Disposition: A | Discharge status: Home / Self Care | Payer: Federal, State, Local not specified - PPO | Source: Ambulatory Visit | Attending: Obstetrics and Gynecology | Admitting: Obstetrics and Gynecology

## 2013-07-28 DIAGNOSIS — N979 Female infertility, unspecified: Secondary | ICD-10-CM | POA: Insufficient documentation

## 2013-07-28 NOTE — Progress Notes (Signed)
Called results to Donnel Saxon. She will come to department to discuss findings and plan with patient.

## 2013-11-22 ENCOUNTER — Other Ambulatory Visit: Payer: Self-pay | Admitting: Obstetrics and Gynecology

## 2014-01-27 ENCOUNTER — Encounter: Payer: Self-pay | Admitting: Obstetrics and Gynecology

## 2016-01-09 IMAGING — RF DG HYSTEROGRAM
8 series · 8 of 8 positions shown · IV contrast (omnipaque)
Comparison: None.

FLUOROSCOPY TIME:  1 min 12 seconds

CLINICAL DATA: Secondary infertility.

EXAM:
HYSTEROSALPINGOGRAM
TECHNIQUE: Following cleansing of the cervix and vagina with Betadine solution,
a hysterosalpingogram was performed using a 5-French
hysterosalpingogram catheter and Omnipaque 300 contrast. The patient
tolerated the examination without difficulty.

[Series 1: run · 1 of 1 slices shown (1 of 8)]
[im 1/1]
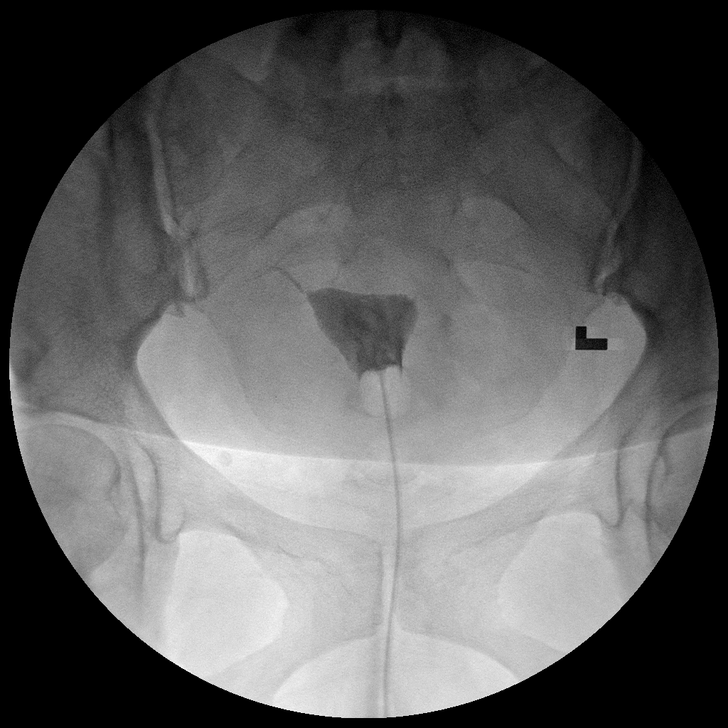

[Series 2: run · 1 of 1 slices shown (2 of 8)]
[im 1/1]
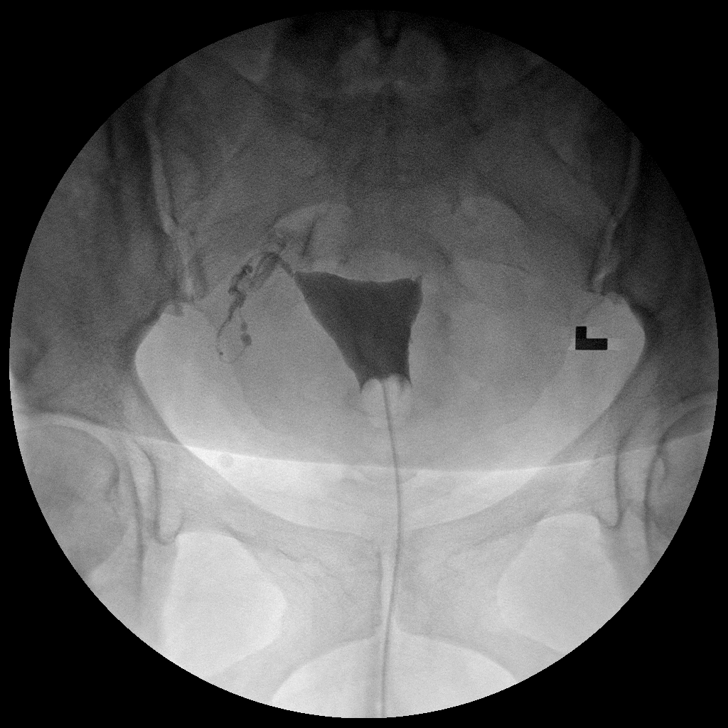

[Series 3: run · 1 of 1 slices shown (3 of 8)]
[im 1/1]
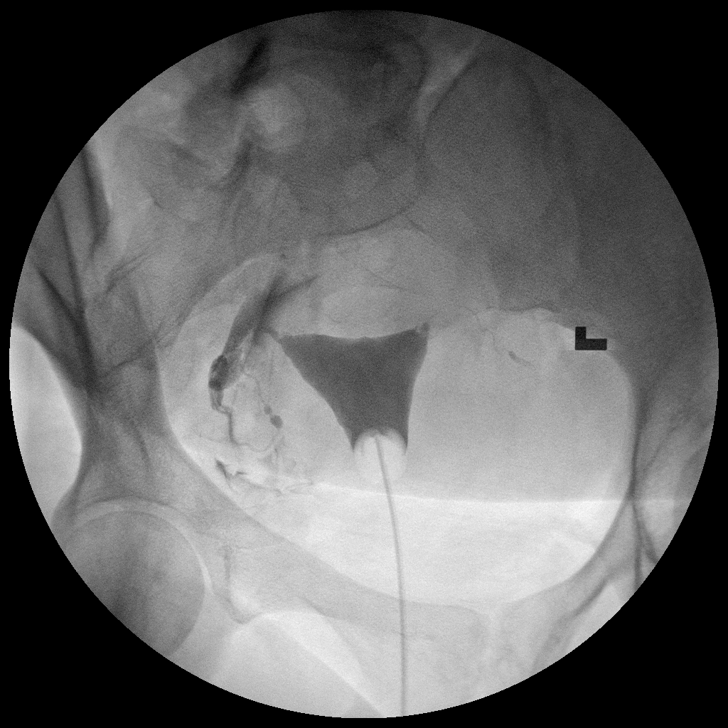

[Series 4: run · 1 of 1 slices shown (4 of 8)]
[im 1/1]
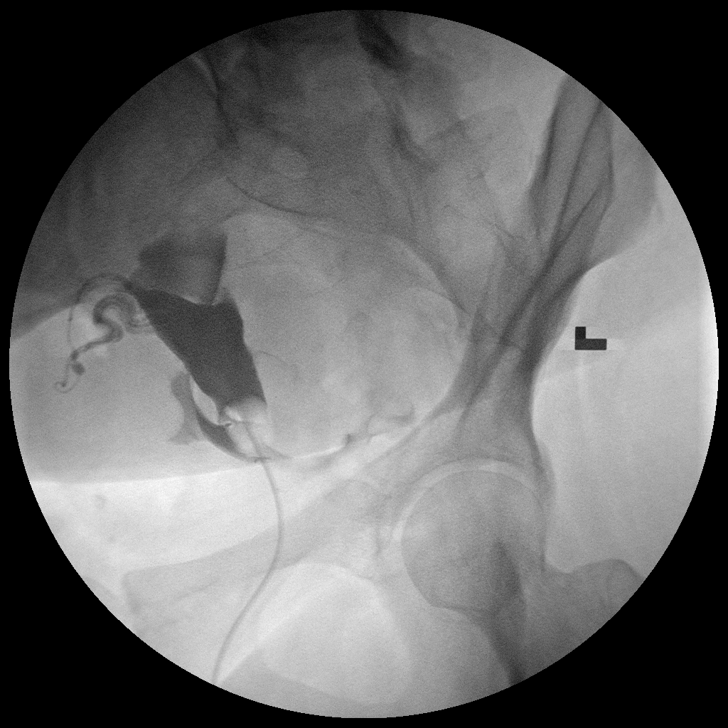

[Series 5: run · 1 of 1 slices shown (5 of 8)]
[im 1/1]
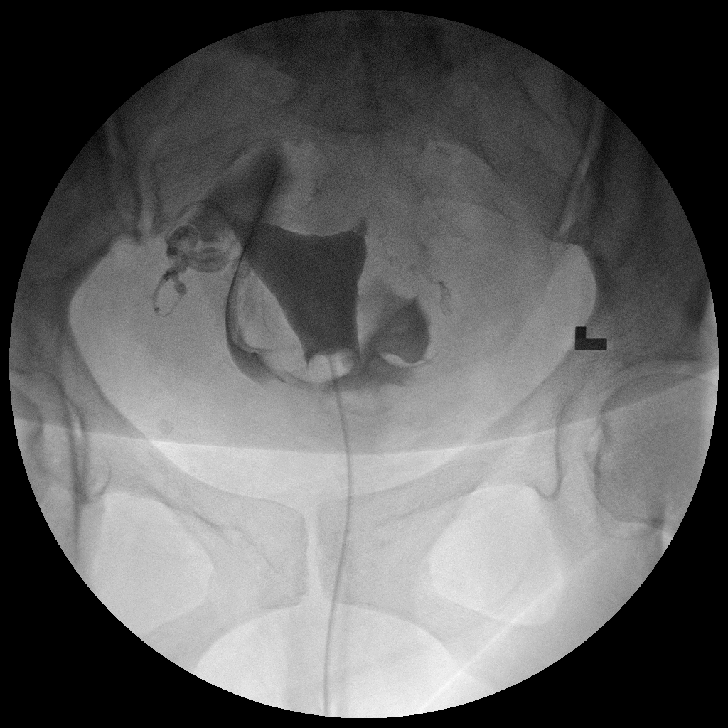

[Series 6: run · 1 of 1 slices shown (6 of 8)]
[im 1/1]
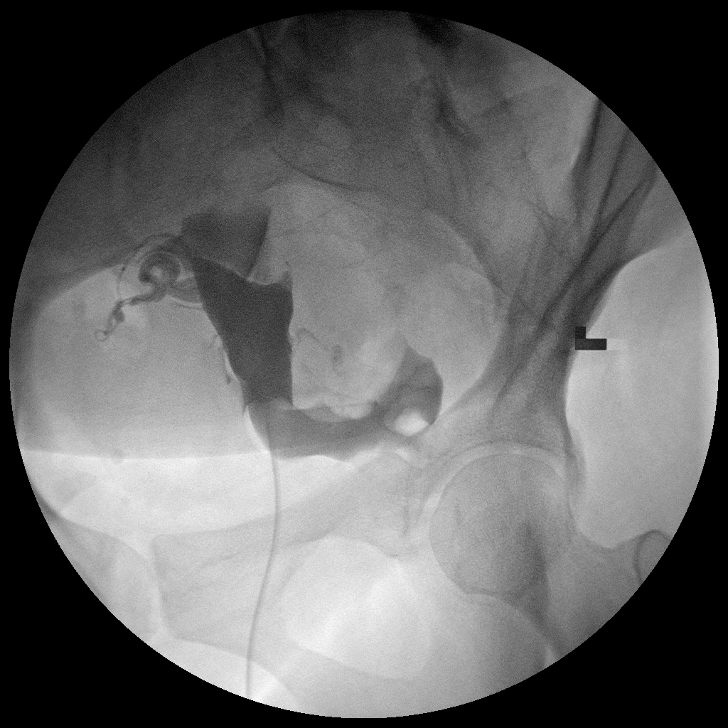

[Series 7: run · 1 of 1 slices shown (7 of 8)]
[im 1/1]
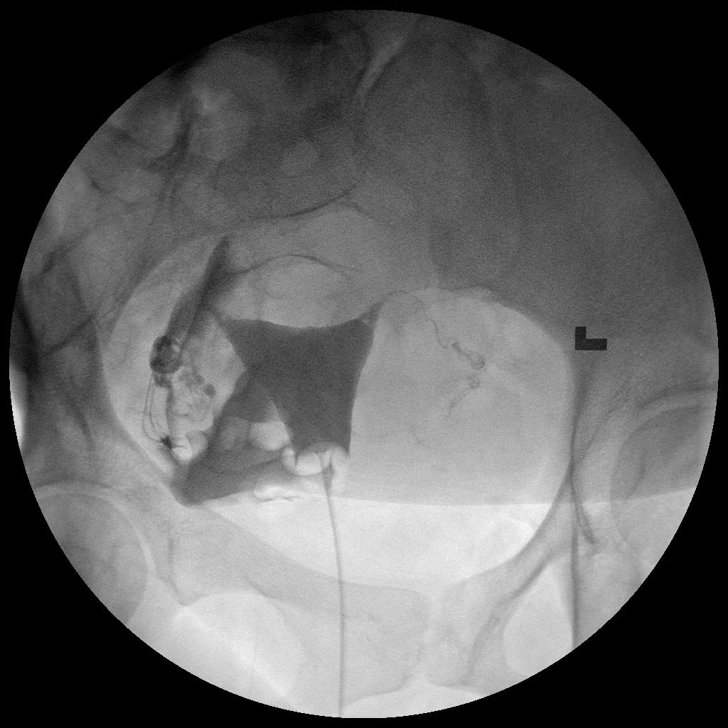

[Series 8: run · 1 of 1 slices shown (8 of 8)]
[im 1/1]
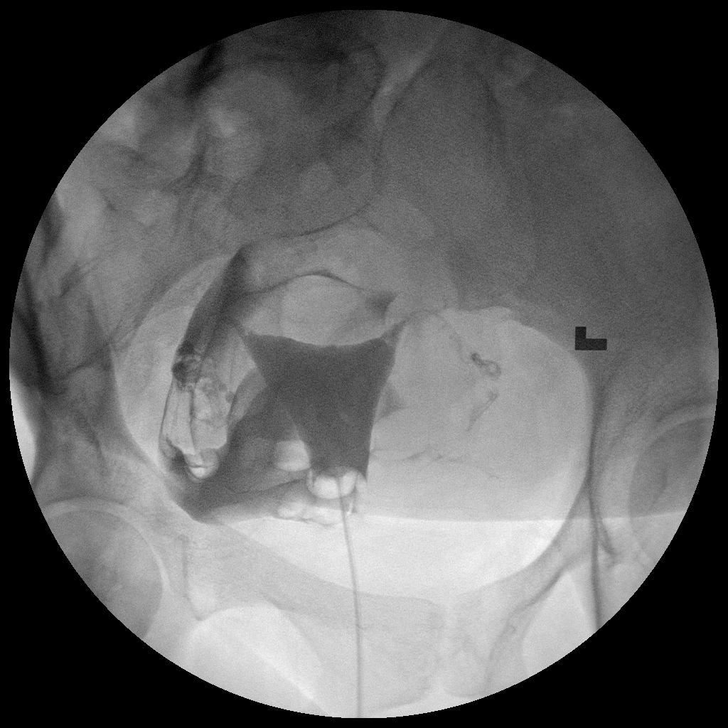

[8 of 8 positions shown; findings below may reference images not displayed]

FINDINGS: The endometrial cavity of the uterus is normal in appearance. No
evidence of Mullerian duct anomaly.

Opacification of the right fallopian tube is seen which is
nondilated. Intraperitoneal spill of contrast from the right
fallopian tube is demonstrated.

Opacification of the proximal left fallopian tube is seen, however
the left fallopian tube is occluded in its proximal to midportion.
No intraperitoneal spill of contrast from the left fallopian tube is
demonstrated.
IMPRESSION: Right fallopian tube is patent.

Left fallopian tube is occluded at its proximal to mid-portion.

Normal appearance of endometrial cavity.

## 2016-02-16 ENCOUNTER — Other Ambulatory Visit: Payer: Self-pay | Admitting: Obstetrics and Gynecology

## 2016-03-18 ENCOUNTER — Other Ambulatory Visit: Payer: Self-pay | Admitting: Obstetrics and Gynecology

## 2017-12-22 ENCOUNTER — Other Ambulatory Visit: Payer: Self-pay | Admitting: Obstetrics and Gynecology

## 2018-06-13 ENCOUNTER — Other Ambulatory Visit: Payer: Self-pay | Admitting: Obstetrics and Gynecology
